# Patient Record
Sex: Male | Born: 1955 | Race: Asian | Hispanic: No | Marital: Married
Health system: Southern US, Community
[De-identification: ages and names within clinical notes are randomized; demographics above are authoritative.]

---

## 2007-05-07 ENCOUNTER — Ambulatory Visit (HOSPITAL_COMMUNITY): Admission: RE | Admit: 2007-05-07 | Discharge: 2007-05-07 | Payer: Self-pay | Admitting: Cardiology

## 2018-04-21 ENCOUNTER — Other Ambulatory Visit: Payer: Self-pay | Admitting: Gastroenterology

## 2018-04-21 DIAGNOSIS — R933 Abnormal findings on diagnostic imaging of other parts of digestive tract: Secondary | ICD-10-CM

## 2018-05-04 ENCOUNTER — Ambulatory Visit
Admission: RE | Admit: 2018-05-04 | Discharge: 2018-05-04 | Disposition: A | Discharge status: Home / Self Care | Payer: BC Managed Care – PPO | Source: Ambulatory Visit | Attending: Gastroenterology | Admitting: Gastroenterology

## 2018-05-04 ENCOUNTER — Other Ambulatory Visit: Payer: Self-pay | Admitting: Gastroenterology

## 2018-05-04 DIAGNOSIS — R933 Abnormal findings on diagnostic imaging of other parts of digestive tract: Secondary | ICD-10-CM

## 2018-05-04 DIAGNOSIS — Z77018 Contact with and (suspected) exposure to other hazardous metals: Secondary | ICD-10-CM

## 2018-05-04 MED ORDER — GADOBENATE DIMEGLUMINE 529 MG/ML IV SOLN
15.0000 mL | Freq: Once | INTRAVENOUS | Status: AC | PRN
  Administered 2018-05-04: 15 mL via INTRAVENOUS

## 2019-04-03 IMAGING — MR MR ABDOMEN WO/W CM MRCP
18 of 20 series · 42 of 48 positions shown · IV contrast (15 ml multihance)
Comparison: CT 04/01/2018

CLINICAL DATA: Abnormal pancreatic duct.  Incidental finding on CT

EXAM:
MRI ABDOMEN WITHOUT AND WITH CONTRAST (INCLUDING MRCP)
TECHNIQUE: Multiplanar multisequence MR imaging of the abdomen was performed
both before and after the administration of intravenous contrast.
Heavily T2-weighted images of the biliary and pancreatic ducts were
obtained, and three-dimensional MRCP images were rendered by post
processing.
CONTRAST:  15mL MULTIHANCE GADOBENATE DIMEGLUMINE 529 MG/ML IV SOLN

[Series 3: T2 · coronal · 5.0mm · 1.56mm/px · 1 of 36 slices shown (1 of 4)]
[im 1/36]
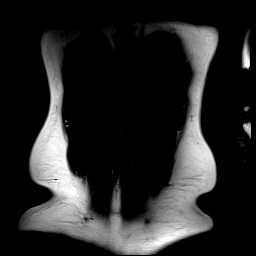

[Series 4: T1 · axial · 3.0mm · 1.19mm/px · z∈[-120,+93]mm · 5 of 144 slices shown]
[im 1/144]
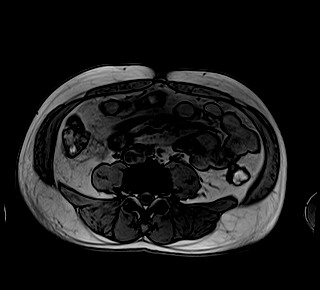
[im 36/144]
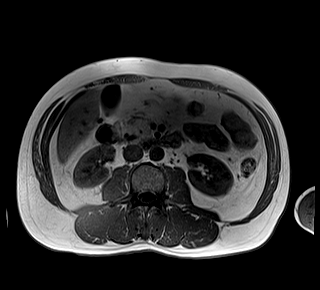
[im 72/144]
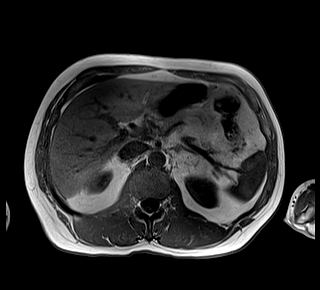
[im 108/144]
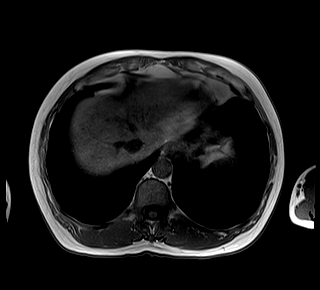
[im 144/144]
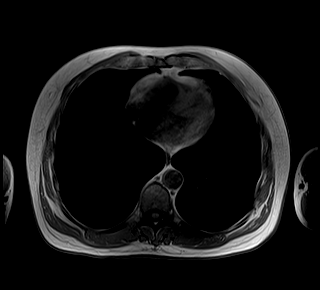

[Series 5: T2 · axial · 5.0mm · 1.48mm/px · 1 of 38 slices shown (2 of 4)]
[im 1/38]
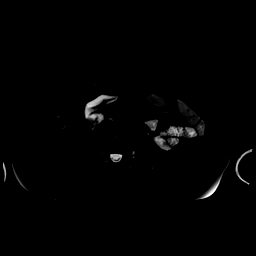

[Series 6: DWI · axial · 5.0mm · 1.42mm/px · z∈[-81,+129]mm · 4 of 108 slices shown (1 of 2)]
[im 1/108]
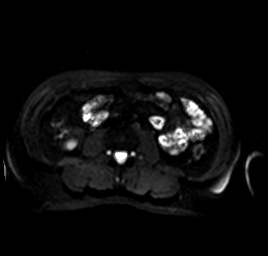
[im 36/108]
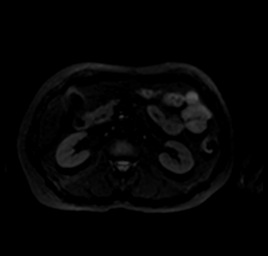
[im 72/108]
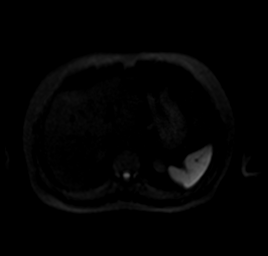
[im 108/108]
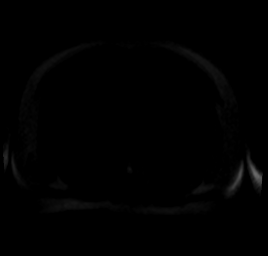

[Series 7: DWI · axial · 5.0mm · 1.42mm/px · 1 of 36 slices shown (2 of 2)]
[im 1/36]
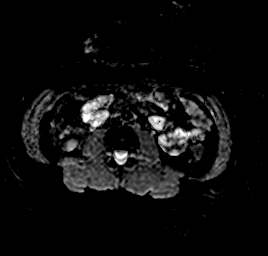

[Series 10: MRCP · coronal · 1.0mm · 0.49mm/px · 2 of 64 slices shown]
[im 1/64]
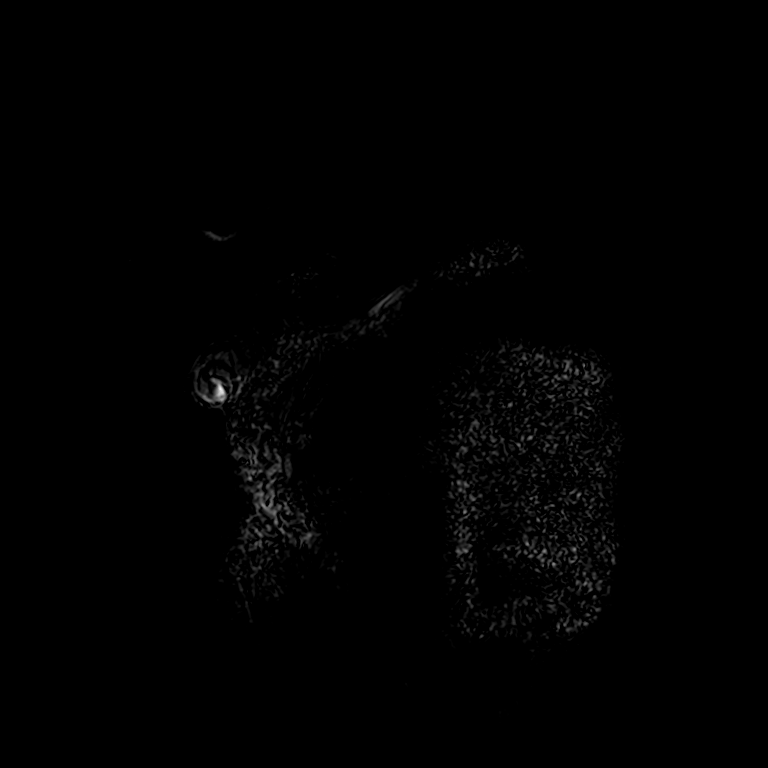
[im 64/64]
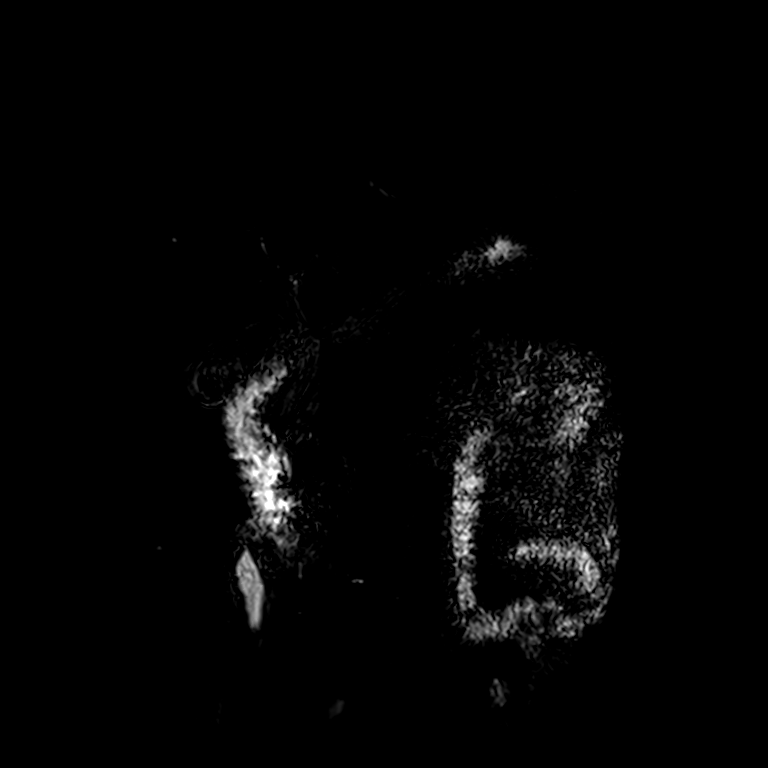

[Series 12: T2 · axial · 6.0mm · 1.22mm/px · 1 of 30 slices shown (3 of 4)]
[im 1/30]
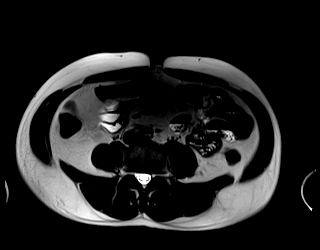

[Series 13: bSSFP · axial · 5.0mm · 1.25mm/px · 1 of 38 slices shown]
[im 1/38]
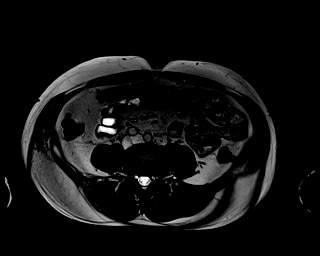

[Series 14: T2 · coronal · 3.0mm · 1.19mm/px · 1 of 19 slices shown (4 of 4)]
[im 1/19]
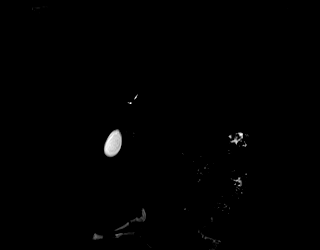

[Series 15: T1 dynamic · axial · non-contrast · 3.0mm · 1.25mm/px · z∈[-128,+85]mm · 3 of 72 slices shown]
[im 1/72]
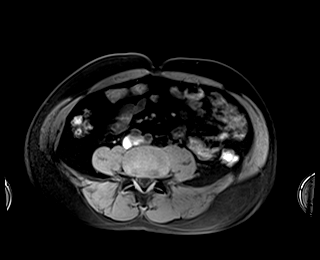
[im 36/72]
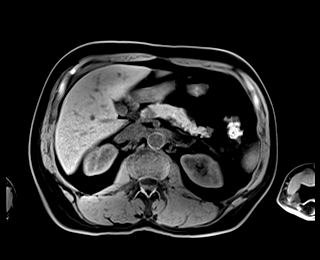
[im 72/72]
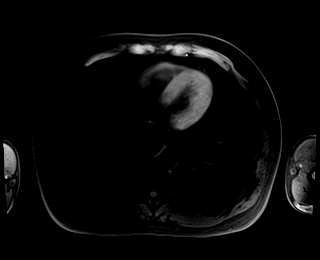

[Series 16: T1 dynamic post-contrast · axial · 3.0mm · 1.25mm/px · z∈[-128,+85]mm · 3 of 72 slices shown (1 of 8)]
[im 1/72]
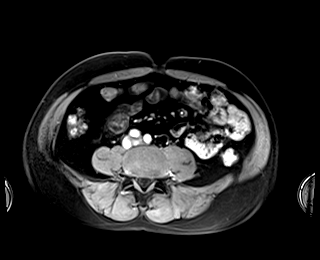
[im 36/72]
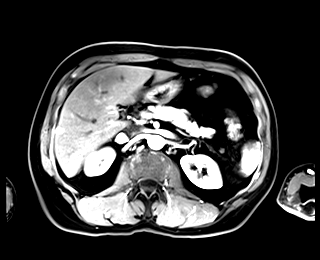
[im 72/72]
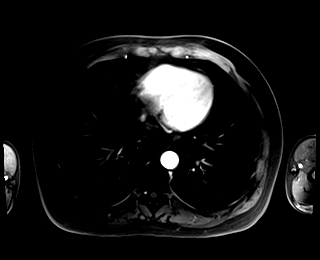

[Series 17: T1 dynamic post-contrast · axial · 3.0mm · 1.25mm/px · z∈[-128,+85]mm · 3 of 72 slices shown (2 of 8)]
[im 1/72]
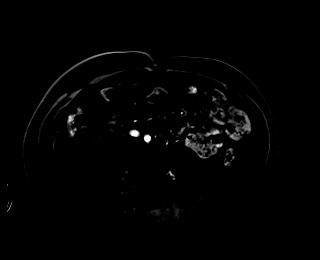
[im 36/72]
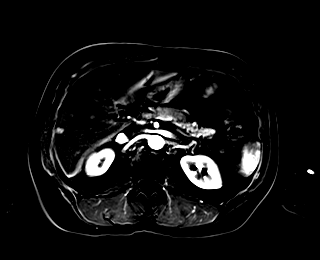
[im 72/72]
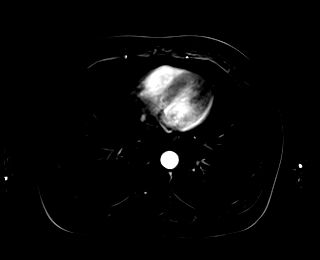

[Series 18: T1 dynamic post-contrast · axial · 3.0mm · 1.25mm/px · z∈[-128,+85]mm · 3 of 72 slices shown (3 of 8)]
[im 1/72]
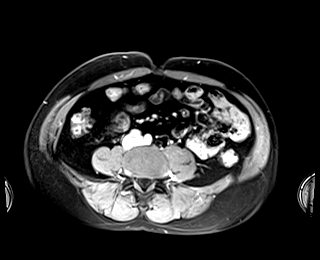
[im 36/72]
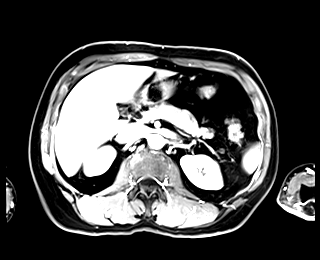
[im 72/72]
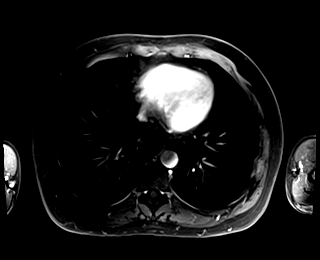

[Series 19: T1 dynamic post-contrast · axial · 3.0mm · 1.25mm/px · z∈[-128,+85]mm · 3 of 72 slices shown (4 of 8)]
[im 1/72]
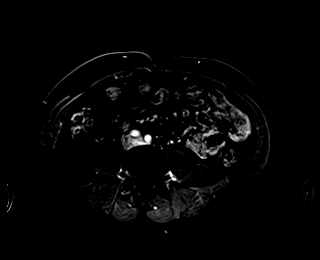
[im 36/72]
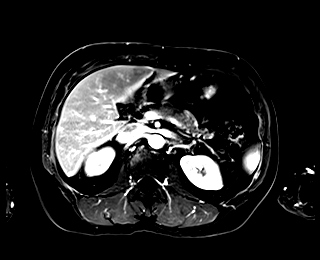
[im 72/72]
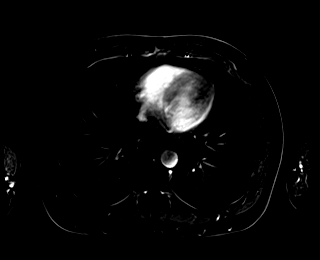

[Series 20: T1 dynamic post-contrast · axial · 3.0mm · 1.25mm/px · z∈[-128,+85]mm · 3 of 72 slices shown (5 of 8)]
[im 1/72]
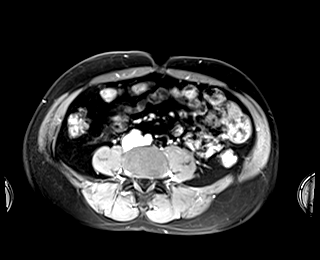
[im 36/72]
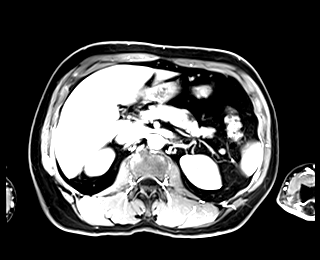
[im 72/72]
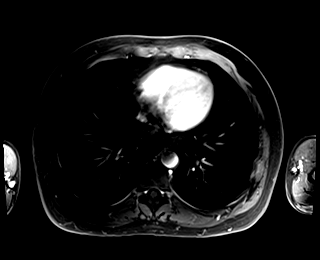

[Series 21: T1 dynamic post-contrast · axial · 3.0mm · 1.25mm/px · z∈[-128,+85]mm · 3 of 72 slices shown (6 of 8)]
[im 1/72]
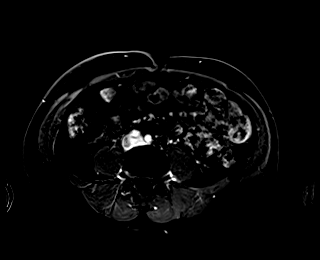
[im 36/72]
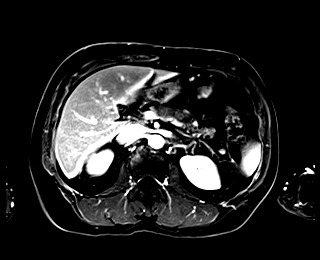
[im 72/72]
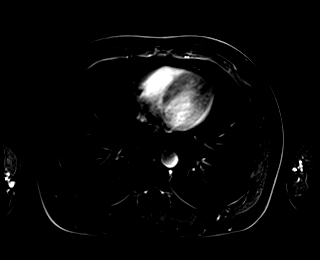

[Series 22: T1 dynamic post-contrast · coronal · 3.0mm · 1.25mm/px · 3 of 72 slices shown (7 of 8)]
[im 1/72]
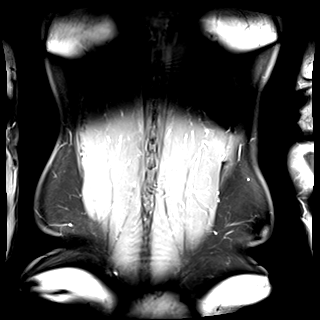
[im 36/72]
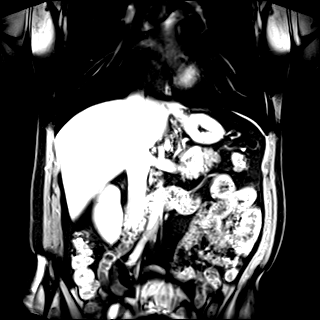
[im 72/72]
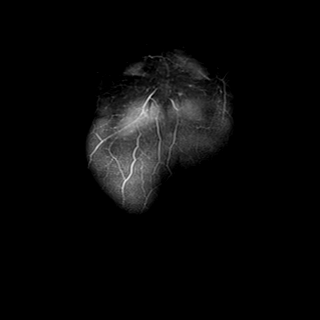

[Series 23: T1 dynamic post-contrast · axial · 3.0mm · 1.25mm/px · 1 of 72 slices shown (8 of 8)]
[im 1/72]
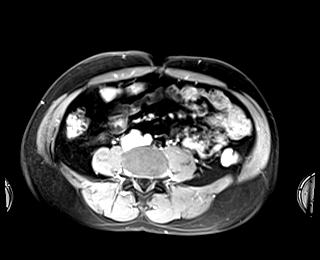

[42 of 48 positions shown; findings below may reference images not displayed]

FINDINGS: Lower chest: Lung bases are clear.

Hepatobiliary: No focal hepatic lesion. No biliary duct dilatation.
Gallbladder is normal. Common bile duct is normal.

On the postcontrast imaging imaging, there is a flash filling lesion
measuring 9 mm in the subcapsular RIGHT hepatic lobe (image 36/16).
This lesion fades to near isointense on the 90 second postcontrast
imaging series. No delayed enhancement.

Pancreas: The distal most several mm of the pancreatic duct leading
up the ampulla is dilated to 6 mm (image [DATE]). There is no
obstructing lesion evident. There is no proximal duct dilatation in
the body or tail the pancreas or uncinate. Distal common bile duct
appears normal. This mild ductal dilatation/ectasia is well seen on
MRCP image [DATE].

No enhancing lesion in the pancreas. The pancreatic parenchyma
otherwise normal.

Spleen: Normal spleen

Adrenals/urinary tract: Adrenal glands and kidneys are normal.

Stomach/Bowel: Stomach lung view of the bowel unremarkable

Vascular/Lymphatic: Abdominal aorta is normal caliber. No periportal
or retroperitoneal adenopathy.

Other: No free fluid.

Musculoskeletal: No aggressive osseous lesion.
IMPRESSION: 1. Short segment of distal dilatation of the pancreatic duct prior
to joining the ampulla. Favor benign variation / ectasia.
2. Small flash filling lesion in the subcapsular RIGHT hepatic lobe
is favored a benign focal nodular hyperplasia or other benign
vascular phenomena.

## 2019-12-23 ENCOUNTER — Ambulatory Visit: Payer: BC Managed Care – PPO | Attending: Internal Medicine

## 2019-12-23 DIAGNOSIS — Z23 Encounter for immunization: Secondary | ICD-10-CM | POA: Insufficient documentation

## 2019-12-23 NOTE — Progress Notes (Signed)
   Covid-[facility] Vaccination [facility]  Name:  Kaeden Mester    MRN: [id number] DOB: 25-Nov-1955  12/23/2019  Mr. Devincenzi was observed post Covid-19 immunization for 15 minutes without incidence. He was provided with Vaccine Information Sheet and instruction to access the V-Safe system.   Mr. Cayer was instructed to call 911 with any severe reactions post vaccine: Marland Kitchen Difficulty breathing  . Swelling of your face and throat  . A fast heartbeat  . A bad rash all over your body  . Dizziness and weakness    Immunizations Administered    Name Date Dose VIS Date Route   Pfizer COVID-19 Vaccine 12/23/2019 12:01 PM 0.3 mL 10/28/2019 Intramuscular   Manufacturer: [employer], [employer]   Lot: [id number]   NDC: [address]-1

## 2020-01-16 ENCOUNTER — Ambulatory Visit: Payer: BC Managed Care – PPO | Attending: Internal Medicine

## 2020-01-16 DIAGNOSIS — Z23 Encounter for immunization: Secondary | ICD-10-CM | POA: Insufficient documentation

## 2020-01-16 NOTE — Progress Notes (Signed)
   Covid-19 Vaccination Clinic  Name:  Scout Gumbs    MRN: [id number] DOB: 12-Nov-1956  01/16/2020  Mr. Nilsen was observed post Covid-19 immunization for 15 minutes without incidence. He was provided with Vaccine Information Sheet and instruction to access the V-Safe system.   Mr. Leger was instructed to call 911 with any severe reactions post vaccine: Marland Kitchen Difficulty breathing  . Swelling of your face and throat  . A fast heartbeat  . A bad rash all over your body  . Dizziness and weakness    Immunizations Administered    Name Date Dose VIS Date Route   Pfizer COVID-19 Vaccine 01/16/2020  8:46 AM 0.3 mL 10/28/2019 Intramuscular   Manufacturer: [employer], [employer]   Lot: [id number]   NDC: [address]-9

## 2020-01-17 ENCOUNTER — Ambulatory Visit: Payer: BC Managed Care – PPO
